# Patient Record
Sex: Male | Born: 2000 | Race: Black or African American | Hispanic: No | Marital: Single | Smoking: Never smoker
Health system: Southern US, Community
[De-identification: ages and names within clinical notes are randomized; demographics above are authoritative.]

---
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
## 2021-02-01 ENCOUNTER — Telehealth: Payer: Self-pay | Admitting: Family Medicine

## 2021-02-01 NOTE — Telephone Encounter (Signed)
Thursday 11:40 (My sch is tight and gone next week as well)

## 2021-02-01 NOTE — Telephone Encounter (Signed)
Got verbal permission to speak with mother Pt's mother states that Skin irritation inner thigh  has been happening on and off for 4 months. The medication proscribed to him for this has worked. But now it is back. Patient would like an app. Just need to know where to put him on the schedule.

## 2021-02-02 ENCOUNTER — Other Ambulatory Visit: Payer: Self-pay

## 2021-02-02 ENCOUNTER — Ambulatory Visit (INDEPENDENT_AMBULATORY_CARE_PROVIDER_SITE_OTHER): Payer: Medicaid Other | Admitting: Family Medicine

## 2021-02-02 VITALS — BP 130/73 | HR 64 | Temp 98.6°F | Ht 70.0 in | Wt 214.0 lb

## 2021-02-02 DIAGNOSIS — Z79899 Other long term (current) drug therapy: Secondary | ICD-10-CM | POA: Diagnosis not present

## 2021-02-02 DIAGNOSIS — R21 Rash and other nonspecific skin eruption: Secondary | ICD-10-CM | POA: Diagnosis not present

## 2021-02-02 MED ORDER — TERBINAFINE HCL 250 MG PO TABS: 250.0000 mg | ORAL_TABLET | Freq: Every day | ORAL | 0 refills | Status: AC

## 2021-02-02 MED ORDER — KETOCONAZOLE 2 % EX CREA: 1.0000 "application " | TOPICAL_CREAM | Freq: Two times a day (BID) | CUTANEOUS | 4 refills | Status: AC

## 2021-02-02 NOTE — Progress Notes (Signed)
   Subjective:    Patient ID: Samuel Keith, male    DOB: Dec 19, 2000, 20 y.o.   MRN: [id number]  HPIitchy rash in groin area. Was seen back in march for same thing. Tried lamisil and triamcinolone cream that was prescribed then but did not help. Found a fungal cream ( lotrimin) at [facility] and started using it. Rash cleared up and came back 3 -4 weeks ago.   Patient states that he does work in a hot environment and gets sweaty denies any fevers chills or other health issues  Review of Systems     Objective:   Physical Exam  Lungs are clear respiratory rate normal heart is regular groin shows tinea cruris bilateral within the skin fairly extensive      Assessment & Plan:  Tinea cruris Ketoconazole apply thin amount twice daily Keep dry wear boxers air out when possible Also recommend Lamisil 250 mg 1 daily for the next 4 weeks Recommend liver profile If needing it beyond this will have to do another liver profile in 4 weeks

## 2021-02-03 LAB — HEPATIC FUNCTION PANEL
ALT: 27 IU/L (ref 0–44)
AST: 23 IU/L (ref 0–40)
Albumin: 5.2 g/dL (ref 4.1–5.2)
Alkaline Phosphatase: 71 IU/L (ref 51–125)
Bilirubin Total: 0.4 mg/dL (ref 0.0–1.2)
Bilirubin, Direct: 0.13 mg/dL (ref 0.00–0.40)
Total Protein: 7.2 g/dL (ref 6.0–8.5)

## 2021-09-25 ENCOUNTER — Ambulatory Visit (INDEPENDENT_AMBULATORY_CARE_PROVIDER_SITE_OTHER): Payer: Medicaid Other | Admitting: Family Medicine

## 2021-09-25 ENCOUNTER — Other Ambulatory Visit: Payer: Self-pay

## 2021-09-25 VITALS — BP 120/82 | HR 60 | Temp 98.1°F | Ht 68.0 in | Wt 220.2 lb

## 2021-09-25 DIAGNOSIS — Z13220 Encounter for screening for lipoid disorders: Secondary | ICD-10-CM

## 2021-09-25 DIAGNOSIS — Z1159 Encounter for screening for other viral diseases: Secondary | ICD-10-CM

## 2021-09-25 DIAGNOSIS — Z114 Encounter for screening for human immunodeficiency virus [HIV]: Secondary | ICD-10-CM | POA: Diagnosis not present

## 2021-09-25 DIAGNOSIS — Z0000 Encounter for general adult medical examination without abnormal findings: Secondary | ICD-10-CM | POA: Diagnosis not present

## 2021-09-25 NOTE — Patient Instructions (Signed)
DASH Eating Plan °DASH stands for Dietary Approaches to Stop Hypertension. The DASH eating plan is a healthy eating plan that has been shown to: °Reduce high blood pressure (hypertension). °Reduce your risk for type 2 diabetes, heart disease, and stroke. °Help with weight loss. °What are tips for following this plan? °Reading food labels °Check food labels for the amount of salt (sodium) per serving. Choose foods with less than 5 percent of the Daily Value of sodium. Generally, foods with less than 300 milligrams (mg) of sodium per serving fit into this eating plan. °To find whole grains, look for the word "whole" as the first word in the ingredient list. °Shopping °Buy products labeled as "low-sodium" or "no salt added." °Buy fresh foods. Avoid canned foods and pre-made or frozen meals. °Cooking °Avoid adding salt when cooking. Use salt-free seasonings or herbs instead of table salt or sea salt. Check with your health care provider or pharmacist before using salt substitutes. °Do not fry foods. Cook foods using healthy methods such as baking, boiling, grilling, roasting, and broiling instead. °Cook with heart-healthy oils, such as olive, canola, avocado, soybean, or sunflower oil. °Meal planning ° °Eat a balanced diet that includes: °4 or more servings of fruits and 4 or more servings of vegetables each day. Try to fill one-half of your plate with fruits and vegetables. °6-8 servings of whole grains each day. °Less than 6 oz (170 g) of lean meat, poultry, or fish each day. A 3-oz (85-g) serving of meat is about the same size as a deck of cards. One egg equals 1 oz (28 g). °2-3 servings of low-fat dairy each day. One serving is 1 cup (237 mL). °1 serving of nuts, seeds, or beans 5 times each week. °2-3 servings of heart-healthy fats. Healthy fats called omega-3 fatty acids are found in foods such as walnuts, flaxseeds, fortified milks, and eggs. These fats are also found in cold-water fish, such as sardines, salmon,  and mackerel. °Limit how much you eat of: °Canned or prepackaged foods. °Food that is high in trans fat, such as some fried foods. °Food that is high in saturated fat, such as fatty meat. °Desserts and other sweets, sugary drinks, and other foods with added sugar. °Full-fat dairy products. °Do not salt foods before eating. °Do not eat more than 4 egg yolks a week. °Try to eat at least 2 vegetarian meals a week. °Eat more home-cooked food and less restaurant, buffet, and fast food. °Lifestyle °When eating at a restaurant, ask that your food be prepared with less salt or no salt, if possible. °If you drink alcohol: °Limit how much you use to: °0-1 drink a day for women who are not pregnant. °0-2 drinks a day for men. °Be aware of how much alcohol is in your drink. In the [place] one drink equals one 12 oz bottle of beer (355 mL), one 5 oz glass of wine (148 mL), or one 1½ oz glass of hard liquor (44 mL). °General information °Avoid eating more than 2,300 mg of salt a day. If you have hypertension, you may need to reduce your sodium intake to 1,500 mg a day. °Work with your health care provider to maintain a healthy body weight or to lose weight. Ask what an ideal weight is for you. °Get at least 30 minutes of exercise that causes your heart to beat faster (aerobic exercise) most days of the week. Activities may include walking, swimming, or biking. °Work with your health care provider or dietitian to   adjust your eating plan to your individual calorie needs. °What foods should I eat? °Fruits °All fresh, dried, or frozen fruit. Canned fruit in natural juice (without added sugar). °Vegetables °Fresh or frozen vegetables (raw, steamed, roasted, or grilled). Low-sodium or reduced-sodium tomato and vegetable juice. Low-sodium or reduced-sodium tomato sauce and tomato paste. Low-sodium or reduced-sodium canned vegetables. °Grains °Whole-grain or whole-wheat bread. Whole-grain or whole-wheat pasta. Brown rice. Oatmeal. Quinoa.  Bulgur. Whole-grain and low-sodium cereals. Pita bread. Low-fat, low-sodium crackers. Whole-wheat flour tortillas. °Meats and other proteins °Skinless chicken or turkey. Ground chicken or turkey. Pork with fat trimmed off. Fish and seafood. Egg whites. Dried beans, peas, or lentils. Unsalted nuts, nut butters, and seeds. Unsalted canned beans. Lean cuts of beef with fat trimmed off. Low-sodium, lean precooked or cured meat, such as sausages or meat loaves. °Dairy °Low-fat (1%) or fat-free (skim) milk. Reduced-fat, low-fat, or fat-free cheeses. Nonfat, low-sodium ricotta or cottage cheese. Low-fat or nonfat yogurt. Low-fat, low-sodium cheese. °Fats and oils °Soft margarine without trans fats. Vegetable oil. Reduced-fat, low-fat, or light mayonnaise and salad dressings (reduced-sodium). Canola, safflower, olive, avocado, soybean, and sunflower oils. Avocado. °Seasonings and condiments °Herbs. Spices. Seasoning mixes without salt. °Other foods °Unsalted popcorn and pretzels. Fat-free sweets. °The items listed above may not be a complete list of foods and beverages you can eat. Contact a dietitian for more information. °What foods should I avoid? °Fruits °Canned fruit in a light or heavy syrup. Fried fruit. Fruit in cream or butter sauce. °Vegetables °Creamed or fried vegetables. Vegetables in a cheese sauce. Regular canned vegetables (not low-sodium or reduced-sodium). Regular canned tomato sauce and paste (not low-sodium or reduced-sodium). Regular tomato and vegetable juice (not low-sodium or reduced-sodium). Pickles. Olives. °Grains °Baked goods made with fat, such as croissants, muffins, or some breads. Dry pasta or rice meal packs. °Meats and other proteins °Fatty cuts of meat. Ribs. Fried meat. Bacon. Bologna, salami, and other precooked or cured meats, such as sausages or meat loaves. Fat from the back of a pig (fatback). Bratwurst. Salted nuts and seeds. Canned beans with added salt. Canned or smoked fish.  Whole eggs or egg yolks. Chicken or turkey with skin. °Dairy °Whole or 2% milk, cream, and half-and-half. Whole or full-fat cream cheese. Whole-fat or sweetened yogurt. Full-fat cheese. Nondairy creamers. Whipped toppings. Processed cheese and cheese spreads. °Fats and oils °Butter. Stick margarine. Lard. Shortening. Ghee. Bacon fat. Tropical oils, such as coconut, palm kernel, or palm oil. °Seasonings and condiments °Onion salt, garlic salt, seasoned salt, table salt, and sea salt. Worcestershire sauce. Tartar sauce. Barbecue sauce. Teriyaki sauce. Soy sauce, including reduced-sodium. Steak sauce. Canned and packaged gravies. Fish sauce. Oyster sauce. Cocktail sauce. Store-bought horseradish. Ketchup. Mustard. Meat flavorings and tenderizers. Bouillon cubes. Hot sauces. Pre-made or packaged marinades. Pre-made or packaged taco seasonings. Relishes. Regular salad dressings. °Other foods °Salted popcorn and pretzels. °The items listed above may not be a complete list of foods and beverages you should avoid. Contact a dietitian for more information. °Where to find more information °National Heart, Lung, and Blood Institute: www.nhlbi.nih.gov °American Heart Association: www.heart.org °Academy of Nutrition and Dietetics: www.eatright.org °National Kidney Foundation: www.kidney.org °Summary °The DASH eating plan is a healthy eating plan that has been shown to reduce high blood pressure (hypertension). It may also reduce your risk for type 2 diabetes, heart disease, and stroke. °When on the DASH eating plan, aim to eat more fresh fruits and vegetables, whole grains, lean proteins, low-fat dairy, and heart-healthy fats. °With the DASH   eating plan, you should limit salt (sodium) intake to 2,300 mg a day. If you have hypertension, you may need to reduce your sodium intake to 1,500 mg a day. °Work with your health care provider or dietitian to adjust your eating plan to your individual calorie needs. °This information is not  intended to replace advice given to you by your health care provider. Make sure you discuss any questions you have with your health care provider. °Document Revised: 06/12/2019 Document Reviewed: 06/12/2019 °Elsevier Patient Education © 2022 Elsevier Inc. ° °

## 2021-09-25 NOTE — Progress Notes (Signed)
? ?  Subjective:  ? ? Patient ID: Samuel Keith, male    DOB: 22-Jan-2001, 21 y.o.   MRN: [id number] ? ?HPI ? ?The patient comes in today for a wellness visit. ?Very nice patient ?Going through school to complete degree with computers ?Has good health habits ?Eats healthy stays active exercises regular basis ?Does a lot of weightlifting ?Practices safe activities ? ? ? ?A review of their health history was completed. ? A review of medications was also completed. ? ?Any needed refills; No ? ?Eating habits: Good ? ?Falls/  MVA accidents in past few months: No ? ?Regular exercise: Yes, weight trains, 5-6 times a week ? ?Specialist pt sees on regular basis: No ? ?Preventative health issues were discussed.  ? ?Additional concerns: No  ? ?Review of Systems ? ?   ?Objective:  ? Physical Exam ?General-in no acute distress ?Eyes-no discharge ?Lungs-respiratory rate normal, CTA ?CV-no murmurs,RRR ?Extremities skin warm dry no edema ?Neuro grossly normal ?Behavior normal, alert ? ? ?Patient does not smoke drink or do drugs ? ?   ?Assessment & Plan:  ?BMI is elevated but patient very muscular. 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 Importance of diet and exercise were discussed in detail.  ?In addition to this a discussion regarding safety was also covered. We also reviewed over immunizations and gave recommendations regarding current immunization needed for age.  ?In addition to this additional areas were also touched on including: ?Preventative health exams needed: ? ?Colonoscopy not indicated ? ?Patient was advised yearly wellness exam ?Screening labs ordered ?

## 2021-09-29 DIAGNOSIS — Z111 Encounter for screening for respiratory tuberculosis: Secondary | ICD-10-CM | POA: Diagnosis not present

## 2021-10-02 DIAGNOSIS — Z111 Encounter for screening for respiratory tuberculosis: Secondary | ICD-10-CM | POA: Diagnosis not present
